# Patient Record
Sex: Male | Born: 1965 | Race: White | Hispanic: No | Marital: Married | Smoking: Never smoker
Health system: Southern US, Community
[De-identification: ages and names within clinical notes are randomized; demographics above are authoritative.]

## PROBLEM LIST (undated history)

## (undated) DIAGNOSIS — I10 Essential (primary) hypertension: Secondary | ICD-10-CM

## (undated) HISTORY — PX: INGUINAL HERNIA REPAIR: SUR1180

---

## 2014-08-20 ENCOUNTER — Ambulatory Visit (INDEPENDENT_AMBULATORY_CARE_PROVIDER_SITE_OTHER): Payer: Self-pay

## 2014-08-20 ENCOUNTER — Encounter (INDEPENDENT_AMBULATORY_CARE_PROVIDER_SITE_OTHER): Payer: Self-pay

## 2014-08-20 ENCOUNTER — Ambulatory Visit (INDEPENDENT_AMBULATORY_CARE_PROVIDER_SITE_OTHER): Payer: No Typology Code available for payment source | Admitting: Neurology

## 2014-08-20 ENCOUNTER — Encounter: Payer: Self-pay | Admitting: *Deleted

## 2014-08-20 DIAGNOSIS — R252 Cramp and spasm: Secondary | ICD-10-CM

## 2014-08-20 DIAGNOSIS — M6281 Muscle weakness (generalized): Secondary | ICD-10-CM

## 2014-08-20 DIAGNOSIS — Z0289 Encounter for other administrative examinations: Secondary | ICD-10-CM

## 2014-08-20 NOTE — Procedures (Signed)
     HISTORY:  Noah Simmons is a 48 year old gentleman with onset of diffuse myalgias that began approximately 4 weeks prior to this evaluation. He has had some improvement in the muscle pain since that time, and he indicates that the pain was associated with muscle cramps. The patient has had some elevations in CK enzyme levels, and he is being evaluated for possible myopathic disorder.  NERVE CONDUCTION STUDIES:  Nerve conduction studies were performed on both lower extremities. The distal motor latencies and motor amplitudes for the peroneal and posterior tibial nerves were within normal limits. The nerve conduction velocities for these nerves were also normal. The H reflex latencies were normal. The sensory latencies for the peroneal nerves were within normal limits.   EMG STUDIES:  EMG study was performed on the left lower extremity:  The tibialis anterior muscle reveals 2 to 4K motor units with full recruitment. No fibrillations or positive waves were seen. The peroneus tertius muscle reveals 2 to 4K motor units with full recruitment. No fibrillations or positive waves were seen. The medial gastrocnemius muscle reveals 1 to 3K motor units with full recruitment. No fibrillations or positive waves were seen. The vastus lateralis muscle reveals 2 to 4K motor units with full recruitment. No fibrillations or positive waves were seen. The iliopsoas muscle reveals 2 to 4K motor units with full recruitment. No fibrillations or positive waves were seen. The biceps femoris muscle (long head) reveals 2 to 4K motor units with full recruitment. No fibrillations or positive waves were seen. The lumbosacral paraspinal muscles were tested at 3 levels, and revealed no abnormalities of insertional activity at all 3 levels tested. There was good relaxation.  EMG study was performed on the right lower extremity:  The tibialis anterior muscle reveals 2 to 4K motor units with full recruitment. No  fibrillations or positive waves were seen. The peroneus tertius muscle reveals 2 to 4K motor units with full recruitment. No fibrillations or positive waves were seen. The medial gastrocnemius muscle reveals 1 to 3K motor units with full recruitment. No fibrillations or positive waves were seen. The vastus lateralis muscle reveals 2 to 4K motor units with full recruitment. No fibrillations or positive waves were seen. The iliopsoas muscle reveals 2 to 4K motor units with full recruitment. No fibrillations or positive waves were seen. The biceps femoris muscle (long head) reveals 2 to 4K motor units with full recruitment. No fibrillations or positive waves were seen. The lumbosacral paraspinal muscles were tested at 3 levels, and revealed no abnormalities of insertional activity at all 3 levels tested. There was good relaxation.   IMPRESSION:  Nerve conduction studies done on both lower extremities were unremarkable, without evidence of a peripheral neuropathy. EMG evaluation of both lower extremities were unremarkable, without evidence of neuropathic or myopathic denervation or evidence of myopathic motor units on EMG.  Marlan Palau MD 08/20/2014 4:56 PM  [facility] [address] [place], [place] [address]  Phone [phone] Fax [phone]

## 2018-10-01 ENCOUNTER — Other Ambulatory Visit: Payer: Self-pay | Admitting: Otolaryngology

## 2018-10-01 DIAGNOSIS — IMO0001 Reserved for inherently not codable concepts without codable children: Secondary | ICD-10-CM

## 2018-10-01 DIAGNOSIS — H918X2 Other specified hearing loss, left ear: Secondary | ICD-10-CM

## 2018-10-12 ENCOUNTER — Ambulatory Visit
Admission: RE | Admit: 2018-10-12 | Discharge: 2018-10-12 | Disposition: A | Discharge status: Home / Self Care | Payer: 59 | Source: Ambulatory Visit | Attending: Otolaryngology | Admitting: Otolaryngology

## 2018-10-12 DIAGNOSIS — H918X2 Other specified hearing loss, left ear: Secondary | ICD-10-CM

## 2018-10-12 DIAGNOSIS — IMO0001 Reserved for inherently not codable concepts without codable children: Secondary | ICD-10-CM

## 2018-10-12 MED ORDER — GADOBENATE DIMEGLUMINE 529 MG/ML IV SOLN
20.0000 mL | Freq: Once | INTRAVENOUS | Status: AC | PRN
  Administered 2018-10-12: 20 mL via INTRAVENOUS

## 2018-10-12 MED ORDER — GADOBENATE DIMEGLUMINE 529 MG/ML IV SOLN: 20.0000 mL | Freq: Once | INTRAVENOUS | Status: DC | PRN

## 2019-07-29 ENCOUNTER — Other Ambulatory Visit: Payer: Self-pay

## 2019-07-29 DIAGNOSIS — Z20822 Contact with and (suspected) exposure to covid-19: Secondary | ICD-10-CM

## 2019-07-30 LAB — NOVEL CORONAVIRUS, NAA: SARS-CoV-2, NAA: NOT DETECTED

## 2020-08-09 ENCOUNTER — Other Ambulatory Visit: Payer: Self-pay

## 2020-08-09 DIAGNOSIS — Z20822 Contact with and (suspected) exposure to covid-19: Secondary | ICD-10-CM

## 2020-08-10 LAB — NOVEL CORONAVIRUS, NAA: SARS-CoV-2, NAA: NOT DETECTED

## 2020-08-13 ENCOUNTER — Other Ambulatory Visit: Payer: 59

## 2020-08-14 IMAGING — MR MR BRAIN/IAC WO/W
11 of 12 series · 43 of 48 positions shown · IV contrast (multihance)
Comparison: None.

CLINICAL DATA: Left-sided hearing loss

Creatinine was obtained on site at [HOSPITAL] at [HOSPITAL].
Results: Creatinine 0.9 mg/dL.
EXAM:
MRI HEAD WITHOUT AND WITH CONTRAST
TECHNIQUE: Multiplanar, multiecho pulse sequences of the brain and surrounding
structures were obtained without and with intravenous contrast.
CONTRAST:  20mL MULTIHANCE GADOBENATE DIMEGLUMINE 529 MG/ML IV SOLN

[Series 5: T1 · sagittal · 4.0mm · 0.72mm/px · 3 of 28 slices shown (1 of 3)]
[im 1/28]
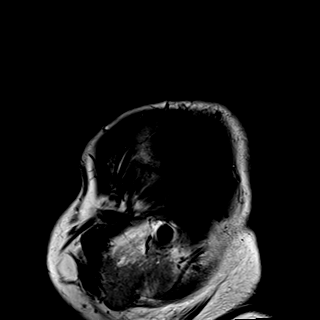
[im 14/28]
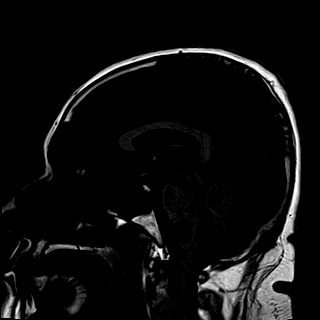
[im 28/28]
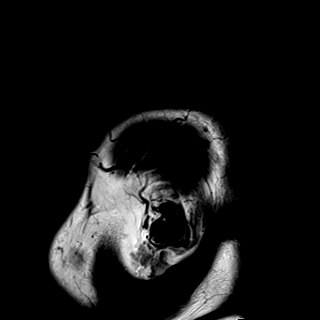

[Series 6: DWI · axial · 3.0mm · 1.44mm/px · z∈[-89,+73]mm · 7 of 86 slices shown (1 of 2)]
[im 1/86]
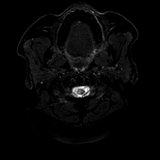
[im 15/86]
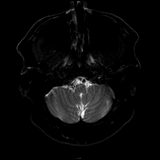
[im 29/86]
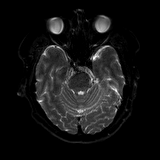
[im 43/86]
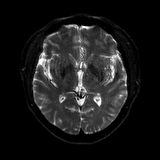
[im 57/86]
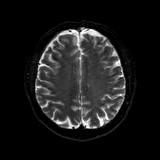
[im 71/86]
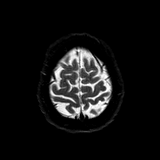
[im 86/86]
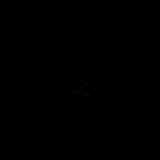

[Series 7: DWI · axial · 3.0mm · 1.44mm/px · z∈[-89,+73]mm · 3 of 43 slices shown (2 of 2)]
[im 1/43]
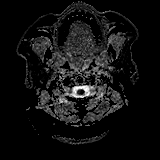
[im 22/43]
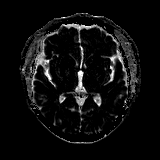
[im 43/43]
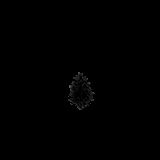

[Series 8: T2 · axial · 4.0mm · 0.36mm/px · z∈[-94,+66]mm · 3 of 32 slices shown]
[im 1/32]
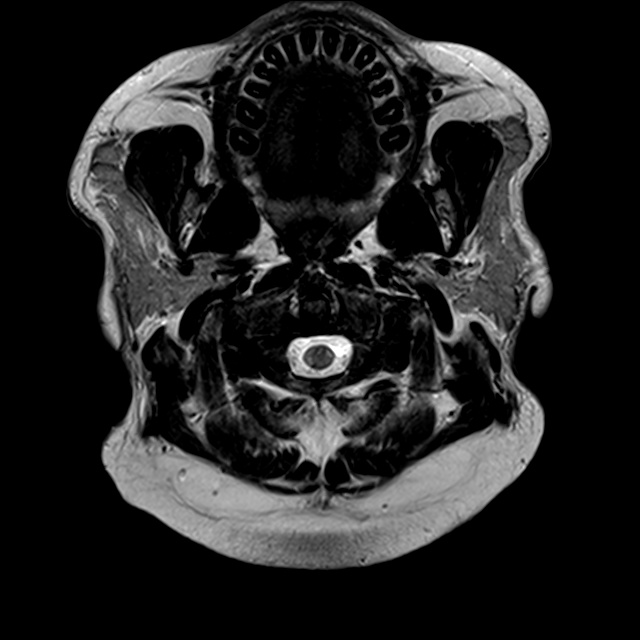
[im 16/32]
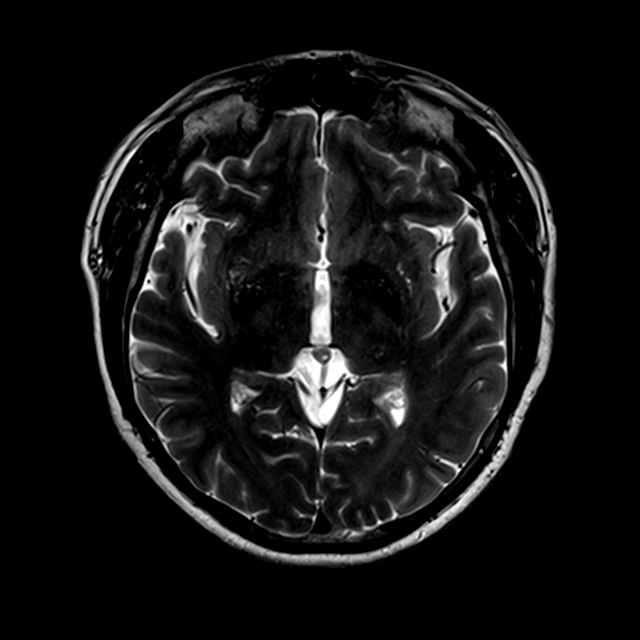
[im 32/32]
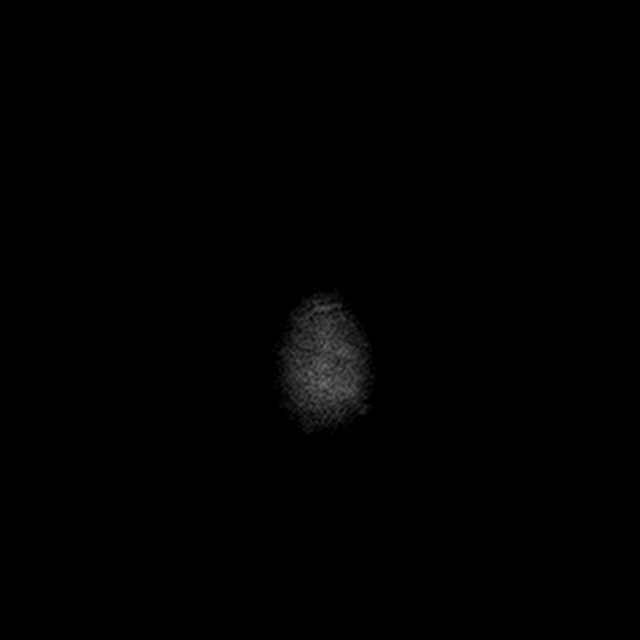

[Series 9: FLAIR · axial · 3.0mm · 0.72mm/px · z∈[-94,+67]mm · 2 of 28 slices shown]
[im 1/28]
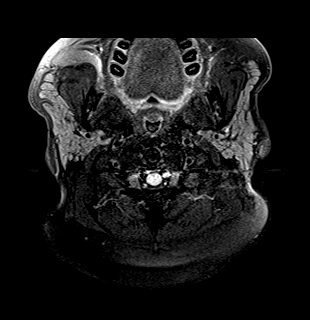
[im 28/28]
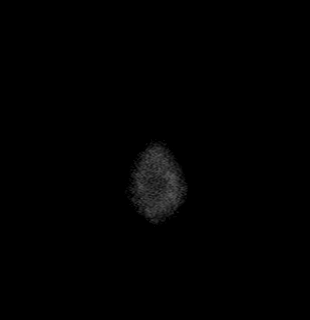

[Series 11: swi_images · axial · 1.5mm · 0.90mm/px · z∈[-85,+68]mm · 8 of 104 slices shown]
[im 1/104]
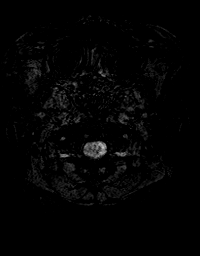
[im 15/104]
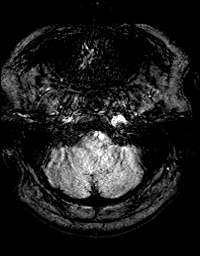
[im 30/104]
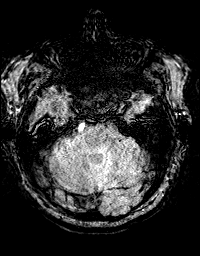
[im 45/104]
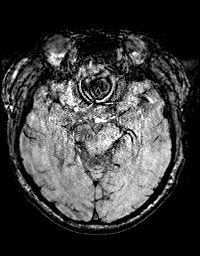
[im 59/104]
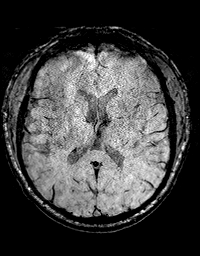
[im 74/104]
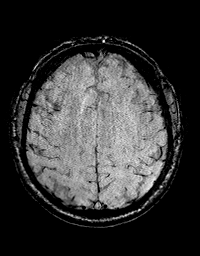
[im 89/104]
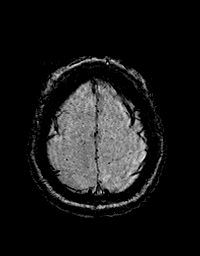
[im 104/104]
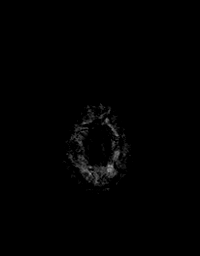

[Series 12: T1 · coronal · 2.5mm · 0.56mm/px · 1 of 13 slices shown (2 of 3)]
[im 1/13]
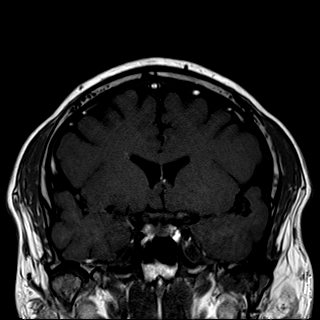

[Series 13: T1 · axial · 2.5mm · 0.50mm/px · 1 of 13 slices shown (3 of 3)]
[im 1/13]
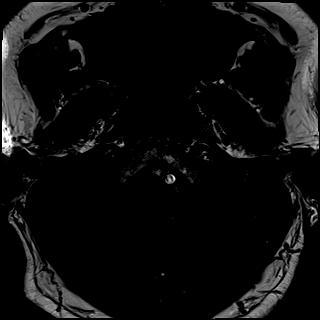

[Series 15: T1 post-contrast · coronal · 2.5mm · 0.56mm/px · 1 of 13 slices shown (1 of 3)]
[im 1/13]
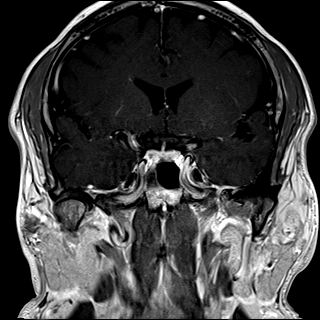

[Series 16: T1 post-contrast · axial · 2.5mm · 0.50mm/px · 1 of 13 slices shown (2 of 3)]
[im 1/13]
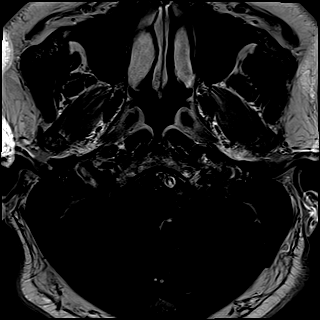

[Series 17: T1 post-contrast · axial · 1.0mm · 0.90mm/px · z∈[-92,+66]mm · 13 of 160 slices shown (3 of 3)]
[im 1/160]
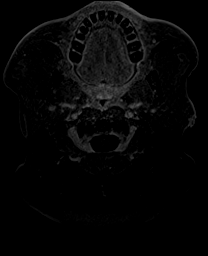
[im 14/160]
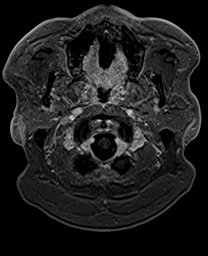
[im 27/160]
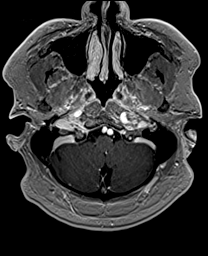
[im 40/160]
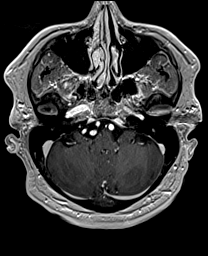
[im 54/160]
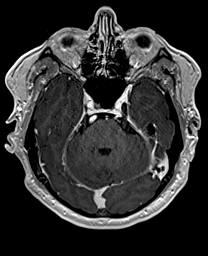
[im 67/160]
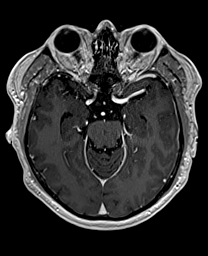
[im 80/160]
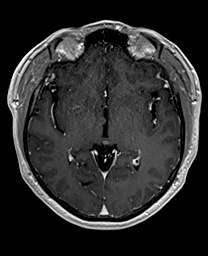
[im 93/160]
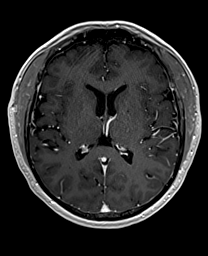
[im 107/160]
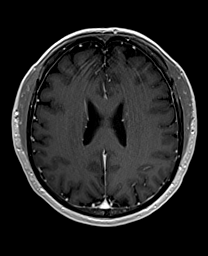
[im 120/160]
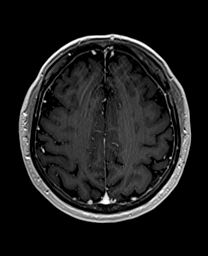
[im 133/160]
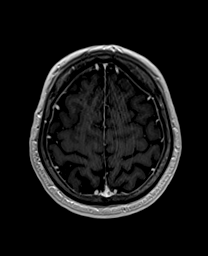
[im 146/160]
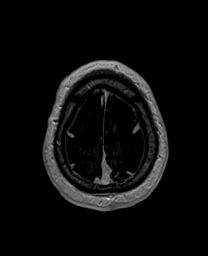
[im 160/160]
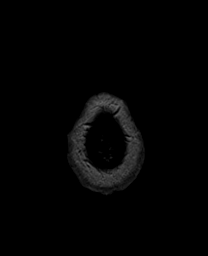

[43 of 48 positions shown; findings below may reference images not displayed]

FINDINGS: BRAIN: There is no acute infarct, acute hemorrhage, hydrocephalus or
extra-axial collection. The midline structures are normal. No
midline shift or other mass effect. There are no old infarcts. The
white matter signal is normal for the patient's age. The cerebral
and cerebellar volume are age-appropriate. Susceptibility-sensitive
sequences show no chronic microhemorrhage or superficial siderosis.

INTERNAL AUDITORY CANALS: There is no cerebellopontine angle mass.
The cochleae and semicircular canals are normal. No focal
abnormality along the course of the 7th and 8th cranial nerves.
Normal porus acusticus and vestibular aqueduct bilaterally.

VASCULAR: Major intracranial arterial and venous sinus flow voids
are normal.

SKULL AND UPPER CERVICAL SPINE: Calvarial bone marrow signal is
normal. There is no skull base mass. Visualized upper cervical spine
and soft tissues are normal.

SINUSES/ORBITS: No fluid levels or advanced mucosal thickening. No
mastoid or middle ear effusion. The orbits are normal.
IMPRESSION: Normal MRI of the brain, internal auditory canals and [DATE] cranial
nerve apparatus.

## 2023-03-29 ENCOUNTER — Ambulatory Visit
Admission: EM | Admit: 2023-03-29 | Discharge: 2023-03-29 | Disposition: A | Discharge status: Home / Self Care | Payer: BC Managed Care – PPO | Attending: Urgent Care | Admitting: Urgent Care

## 2023-03-29 DIAGNOSIS — J069 Acute upper respiratory infection, unspecified: Secondary | ICD-10-CM | POA: Insufficient documentation

## 2023-03-29 DIAGNOSIS — J310 Chronic rhinitis: Secondary | ICD-10-CM | POA: Diagnosis not present

## 2023-03-29 DIAGNOSIS — B349 Viral infection, unspecified: Secondary | ICD-10-CM | POA: Insufficient documentation

## 2023-03-29 DIAGNOSIS — I10 Essential (primary) hypertension: Secondary | ICD-10-CM | POA: Insufficient documentation

## 2023-03-29 DIAGNOSIS — Z1152 Encounter for screening for COVID-19: Secondary | ICD-10-CM | POA: Insufficient documentation

## 2023-03-29 HISTORY — DX: Essential (primary) hypertension: I10

## 2023-03-29 LAB — POCT INFLUENZA A/B
Influenza A, POC: NEGATIVE
Influenza B, POC: NEGATIVE

## 2023-03-29 MED ORDER — PROMETHAZINE-DM 6.25-15 MG/5ML PO SYRP: 5.0000 mL | ORAL_SOLUTION | Freq: Three times a day (TID) | ORAL | 0 refills | Status: AC | PRN

## 2023-03-29 MED ORDER — CETIRIZINE HCL 10 MG PO TABS: 10.0000 mg | ORAL_TABLET | Freq: Every day | ORAL | 0 refills | Status: AC

## 2023-03-29 MED ORDER — PSEUDOEPHEDRINE HCL 30 MG PO TABS: 30.0000 mg | ORAL_TABLET | Freq: Three times a day (TID) | ORAL | 0 refills | Status: AC | PRN

## 2023-03-29 NOTE — Discharge Instructions (Signed)
Para el dolor de garganta o tos puede usar un t de miel. Use 3 cucharaditas de miel con jugo exprimido de medio limn. Coloque trozos de jengibre afeitado en 1/2-1 taza de agua y caliente sobre la estufa. Luego mezcle los ingredientes y repita cada 4 horas. Para fiebre, dolores de cuerpo tome ibuprofeno 400mg-600mg con comida cada 6 horas alternando con o junto con Tylenol 500mg-650mg cada 6 horas. Hidrata muy bien con al menos 2 litros (64 onzas) de agua al dia. Coma comidas ligeras como sopas para mantener los electrolitos y nutricion. Tambien puede tomar suero. Comience un antihistamnico como Zyrtec (cetirizina) 10mg al dia. Puede usar pseudoefedrina (Sudafed) de venta libre para el goteo posnasal, congestin a una dosis de 30 mg cada 8 horas o cada 12 horas. Use el jarabe por la noche para su tos y las capsulas durante el dia.  

## 2023-03-29 NOTE — ED Provider Notes (Signed)
[facility]  Note:  This document was prepared using Conservation officer, historic buildings and may include unintentional dictation errors.  MRN: [id number] DOB: 12/29/1965  Subjective:   Noah Simmons is a 57 y.o. male presenting for 3-day history of body pains, sore throat, postnasal drainage, sinus congestion, coughing.  No chest pain, shortness of breath or wheezing.  No history of asthma.  No smoking of any kind including cigarettes, cigars, vaping, marijuana use.     Patient takes lisinopril for his blood pressure.   No Known Allergies  Past Medical History:  Diagnosis Date   Hypertension      Past Surgical History:  Procedure Laterality Date   INGUINAL HERNIA REPAIR      History reviewed. No pertinent family history.  Social History   Tobacco Use   Smoking status: Never   Smokeless tobacco: Never  Vaping Use   Vaping Use: Never used  Substance Use Topics   Alcohol use: Yes    Comment: [place]   Drug use: Never    ROS   Objective:   Vitals: BP (!) 167/104 (BP Location: Right Arm)   Pulse 97   Temp 98 F (36.7 C) (Oral)   Resp 20   SpO2 94%   Physical Exam Constitutional:      General: He is not in acute distress.    Appearance: Normal appearance. He is well-developed and normal weight. He is not ill-appearing, toxic-appearing or diaphoretic.  HENT:     Head: Normocephalic and atraumatic.     Right Ear: Tympanic membrane, ear canal and external ear normal. No drainage, swelling or tenderness. No middle ear effusion. There is no impacted cerumen. Tympanic membrane is not erythematous or bulging.     Left Ear: Tympanic membrane, ear canal and external ear normal. No drainage, swelling or tenderness.  No middle ear effusion. There is no impacted cerumen. Tympanic membrane is not erythematous or bulging.     Nose: Congestion present. No rhinorrhea.     Mouth/Throat:     Mouth: Mucous membranes are moist.     Pharynx: No  oropharyngeal exudate or posterior oropharyngeal erythema.     Comments: Thick postnasal drainage overlying pharynx. Eyes:     General: No scleral icterus.       Right eye: No discharge.        Left eye: No discharge.     Extraocular Movements: Extraocular movements intact.     Conjunctiva/sclera: Conjunctivae normal.  Cardiovascular:     Rate and Rhythm: Normal rate and regular rhythm.     Heart sounds: Normal heart sounds. No murmur heard.    No friction rub. No gallop.  Pulmonary:     Effort: Pulmonary effort is normal. No respiratory distress.     Breath sounds: Normal breath sounds. No stridor. No wheezing, rhonchi or rales.  Musculoskeletal:     Cervical back: Normal range of motion and neck supple. No rigidity. No muscular tenderness.  Skin:    General: Skin is warm and dry.  Neurological:     General: No focal deficit present.     Mental Status: He is alert and oriented to person, place, and time.  Psychiatric:        Mood and Affect: Mood normal.        Behavior: Behavior normal.        Thought Content: Thought content normal.    Results for orders placed or performed during the hospital encounter of 03/29/23 (from the  past 24 hour(s))  POCT Influenza A/B     Status: None   Collection Time: 03/29/23 11:22 AM  Result Value Ref Range   Influenza A, POC Negative Negative   Influenza B, POC Negative Negative    Assessment and Plan :   PDMP not reviewed this encounter.  1. Acute viral syndrome   2. Essential hypertension    Deferred imaging given clear cardiopulmonary exam, hemodynamically stable vital signs. Will manage for viral illness such as viral URI, viral syndrome, viral rhinitis, COVID-19. Recommended supportive care. Offered scripts for symptomatic relief. Testing is pending. Counseled patient on potential for adverse effects with medications prescribed/recommended today, ER and return-to-clinic precautions discussed, patient verbalized understanding.    Patient would be a good candidate for Paxlovid should he test positive for this.   Wallis Bamberg, [place] 03/29/23 1148

## 2023-03-29 NOTE — ED Triage Notes (Signed)
Pt reports cough, sore throat and nasal congestion x 3 days; body aches x 1 day. Robitussin, ibuporfen and hot tea gives some relief.

## 2023-03-30 LAB — SARS CORONAVIRUS 2 (TAT 6-24 HRS): SARS Coronavirus 2: NEGATIVE
